# Patient Record
Sex: Male | Born: 1990 | Race: Black or African American | Hispanic: No | Marital: Married
Health system: Southern US, Community
[De-identification: ages and names within clinical notes are randomized; demographics above are authoritative.]

---
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
## 2009-04-23 ENCOUNTER — Encounter: Admission: RE | Admit: 2009-04-23 | Discharge: 2009-04-23 | Payer: Self-pay | Admitting: Family Medicine

## 2010-09-05 IMAGING — CR DG LUMBAR SPINE COMPLETE 4+V
5 series · 5 of 5 positions shown · non-contrast
Comparison: None

CLINICAL DATA: Chronic low back pain, no acute injury

LUMBAR SPINE - COMPLETE 4+ VIEW

[view not recorded (1 of 5)]
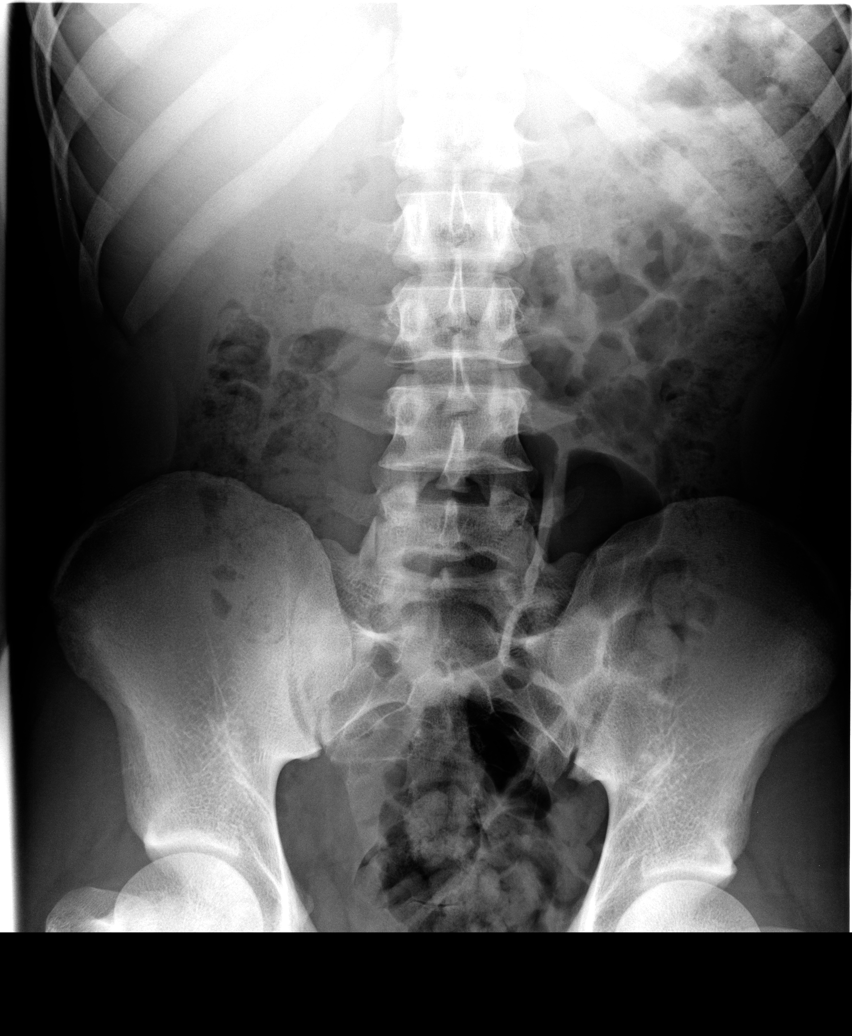

[view not recorded (2 of 5)]
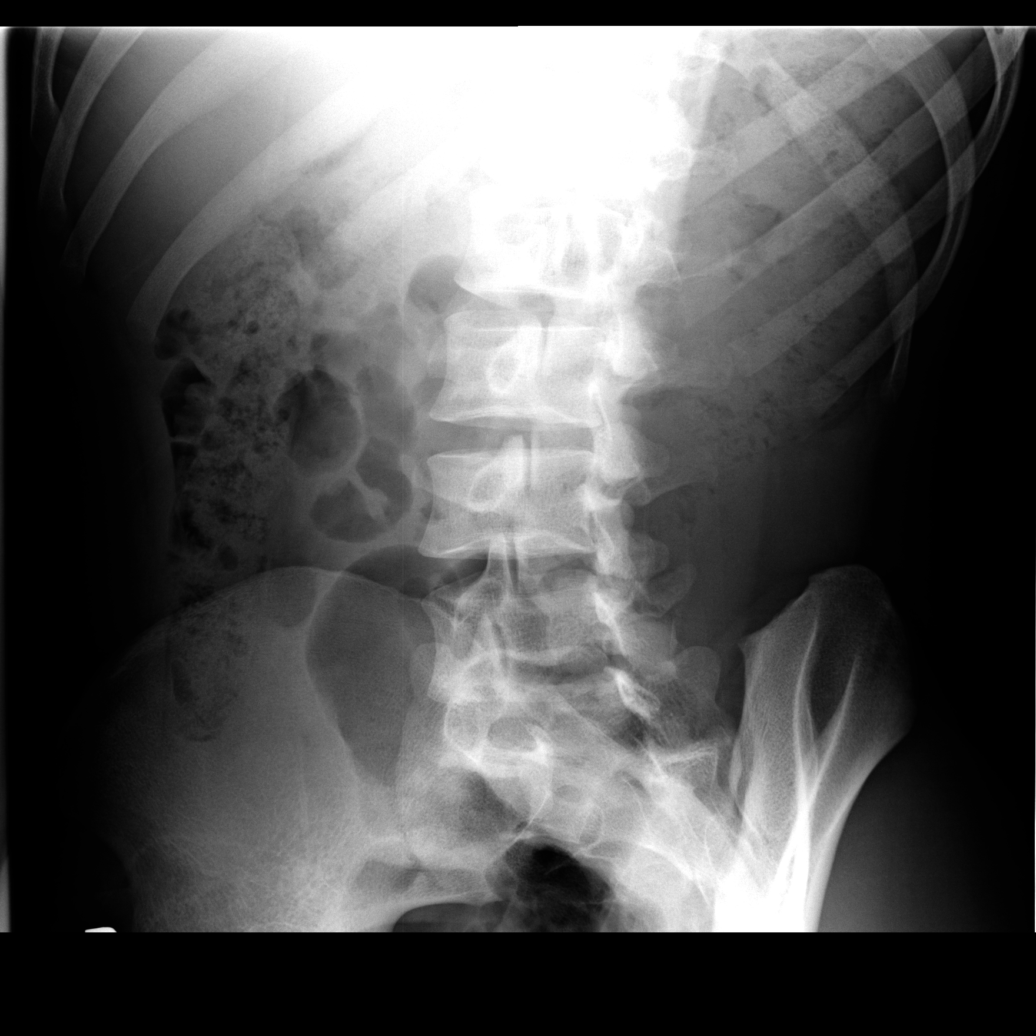

[view not recorded (3 of 5)]
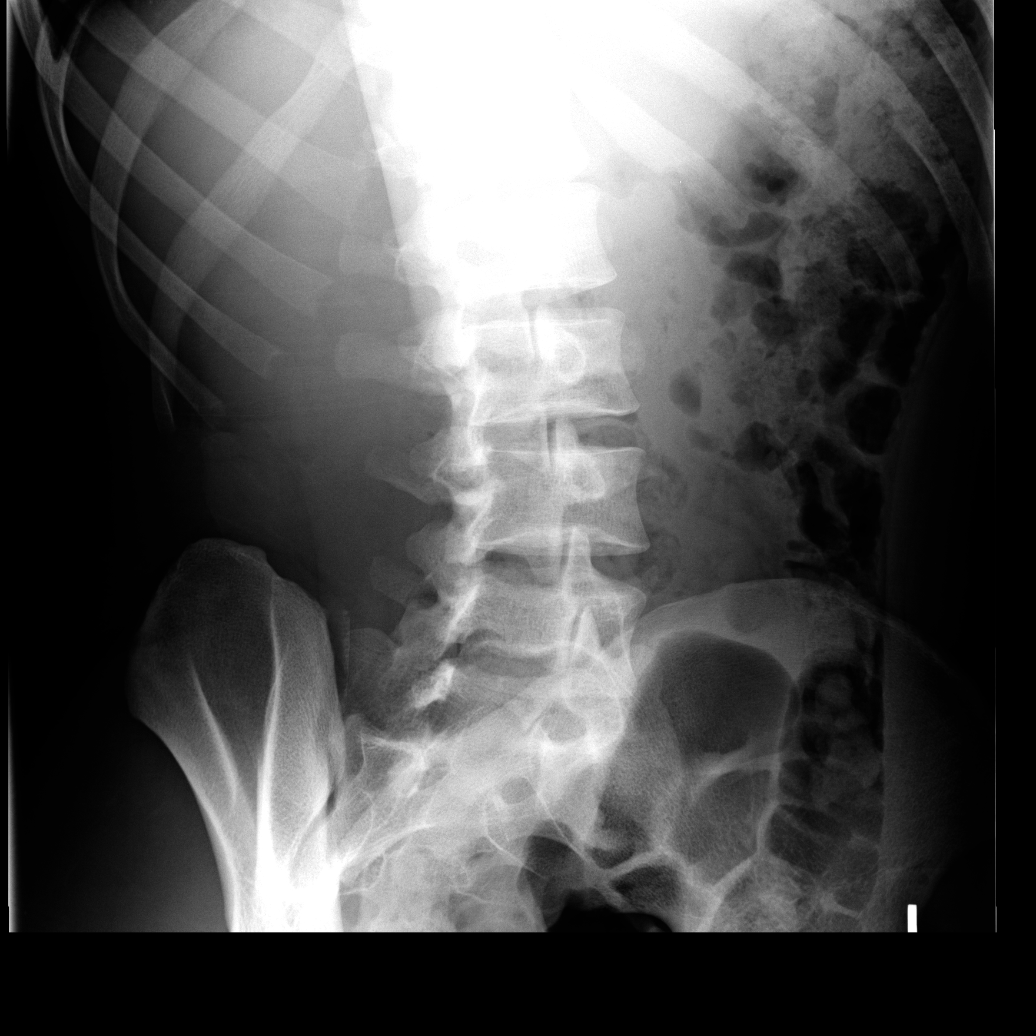

[view not recorded (4 of 5)]
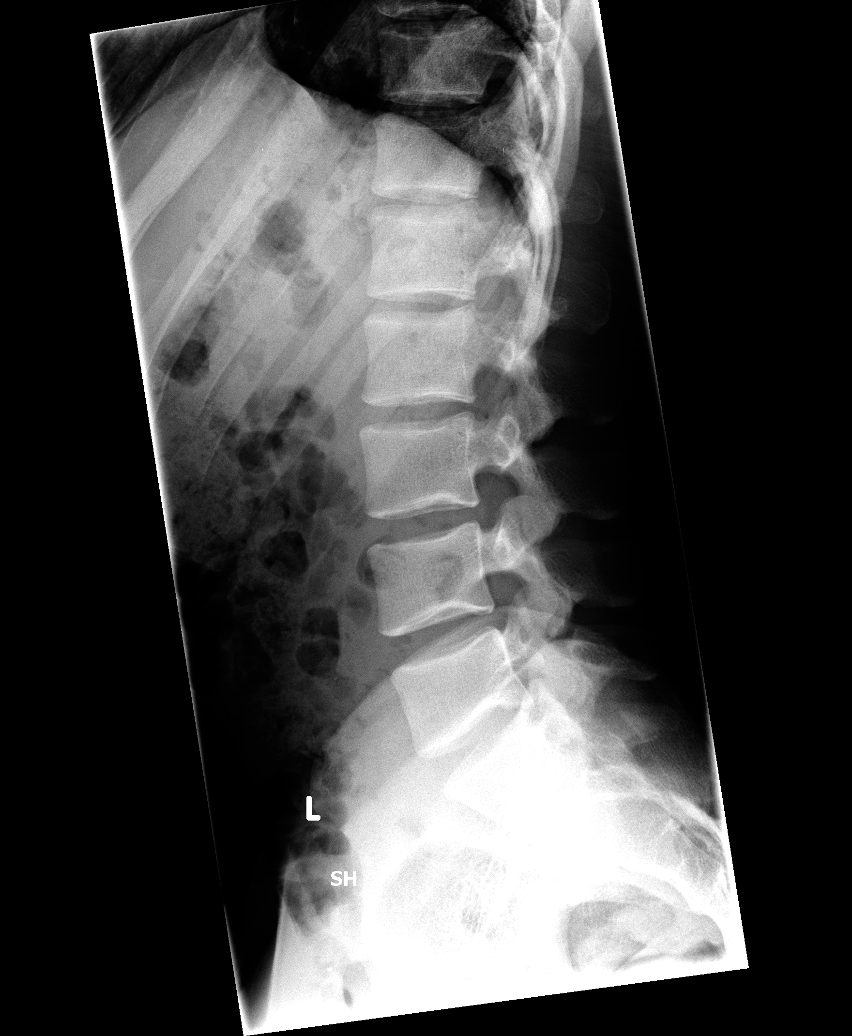

[view not recorded (5 of 5)]
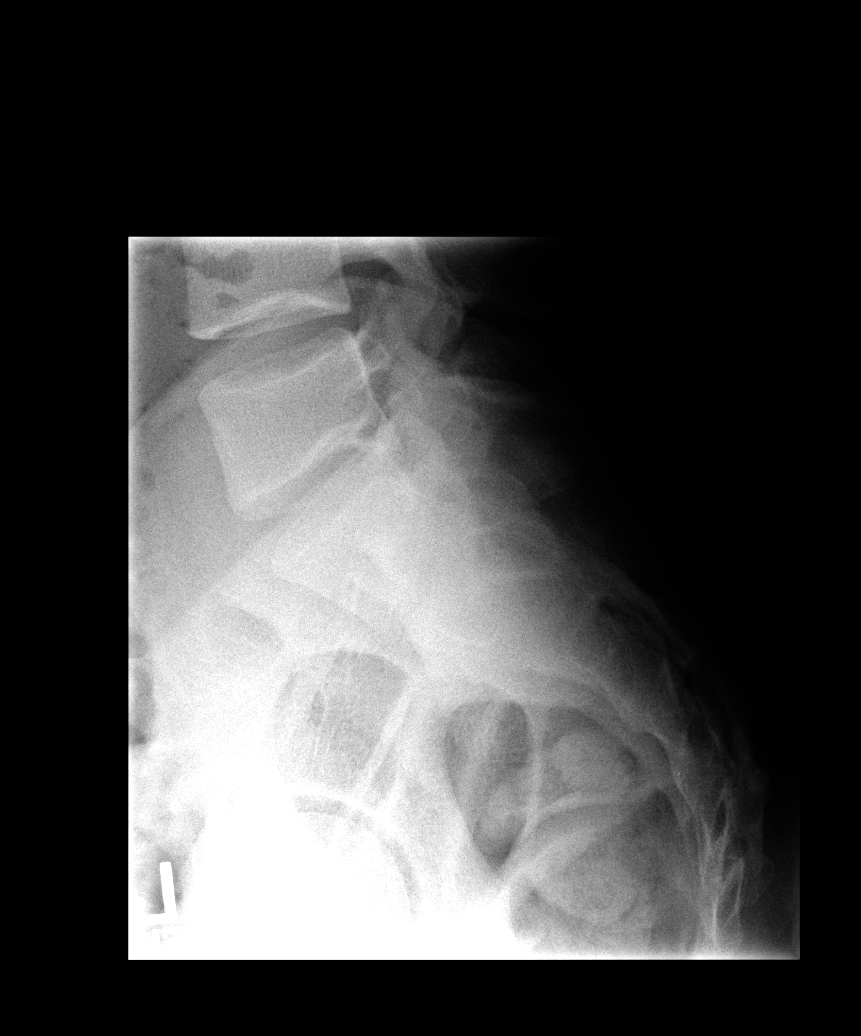

[5 of 5 positions shown; findings below may reference images not displayed]

FINDINGS: The lumbar vertebrae are in normal alignment with normal
intervertebral disc spaces.  No compression deformity is seen.  No
abnormality of the facet joints is noted.  The SI joints are well
corticated.
IMPRESSION: Normal alignment with normal disc spaces.

## 2018-09-04 ENCOUNTER — Other Ambulatory Visit: Payer: Self-pay | Admitting: Family Medicine

## 2018-09-04 ENCOUNTER — Other Ambulatory Visit (HOSPITAL_COMMUNITY)
Admission: RE | Admit: 2018-09-04 | Discharge: 2018-09-04 | Disposition: A | Discharge status: Home / Self Care | Payer: Self-pay | Source: Ambulatory Visit | Attending: Family Medicine | Admitting: Family Medicine

## 2018-09-04 DIAGNOSIS — Z113 Encounter for screening for infections with a predominantly sexual mode of transmission: Secondary | ICD-10-CM | POA: Insufficient documentation

## 2018-09-07 LAB — URINE CYTOLOGY ANCILLARY ONLY
Bacterial vaginitis: NEGATIVE
Candida vaginitis: NEGATIVE
TRICH (WINDOWPATH): NEGATIVE

## 2020-08-19 ENCOUNTER — Other Ambulatory Visit (HOSPITAL_COMMUNITY)
Admission: RE | Admit: 2020-08-19 | Discharge: 2020-08-19 | Disposition: A | Discharge status: Home / Self Care | Payer: Self-pay | Source: Ambulatory Visit | Attending: Family Medicine | Admitting: Family Medicine

## 2020-08-19 DIAGNOSIS — Z113 Encounter for screening for infections with a predominantly sexual mode of transmission: Secondary | ICD-10-CM | POA: Insufficient documentation

## 2020-08-20 ENCOUNTER — Other Ambulatory Visit: Payer: Self-pay | Admitting: Family Medicine

## 2020-08-24 LAB — MOLECULAR ANCILLARY ONLY
Bacterial Vaginitis (gardnerella): NEGATIVE
Candida Glabrata: NEGATIVE
Candida Vaginitis: NEGATIVE
Chlamydia: NEGATIVE
Comment: NEGATIVE
Comment: NEGATIVE
Comment: NEGATIVE
Comment: NEGATIVE
Comment: NEGATIVE
Comment: NORMAL
Neisseria Gonorrhea: NEGATIVE
Trichomonas: NEGATIVE

## 2021-09-22 DEATH — deceased

## 2021-10-12 ENCOUNTER — Institutional Professional Consult (permissible substitution): Payer: Self-pay | Admitting: Internal Medicine

## 2021-10-12 NOTE — Progress Notes (Unsigned)
° °  Sean Haley, male    DOB: [birth year],    MRN: [id number]   Brief patient profile:  ***  yo*** *** referred to pulmonary clinic in [place]  10/12/2021 by *** for ***      History of Present Illness  10/12/2021  Pulmonary/ 1st office eval/ [facility] / [place] [facility]  No chief complaint on file.    Dyspnea:  *** Cough: *** Sleep: *** SABA use:   No past medical history on file.  No outpatient medications prior to visit.   No facility-administered medications prior to visit.     Objective:     There were no vitals taken for this visit.         Assessment   No problem-specific Assessment & Plan notes found for this encounter.     Sandrea Hughs, MD 10/12/2021
